# Patient Record
Sex: Male | Born: 1990 | Race: White | Hispanic: No | Marital: Married | Smoking: Never smoker
Health system: Southern US, Community
[De-identification: ages and names within clinical notes are randomized; demographics above are authoritative.]

## PROBLEM LIST (undated history)

## (undated) DIAGNOSIS — S62336A Displaced fracture of neck of fifth metacarpal bone, right hand, initial encounter for closed fracture: Secondary | ICD-10-CM

## (undated) DIAGNOSIS — G709 Myoneural disorder, unspecified: Secondary | ICD-10-CM

## (undated) HISTORY — PX: WISDOM TOOTH EXTRACTION: SHX21

---

## 2012-12-16 ENCOUNTER — Encounter (HOSPITAL_BASED_OUTPATIENT_CLINIC_OR_DEPARTMENT_OTHER): Payer: Self-pay | Admitting: *Deleted

## 2012-12-17 ENCOUNTER — Encounter (HOSPITAL_BASED_OUTPATIENT_CLINIC_OR_DEPARTMENT_OTHER): Payer: Self-pay | Admitting: Anesthesiology

## 2012-12-17 ENCOUNTER — Encounter (HOSPITAL_BASED_OUTPATIENT_CLINIC_OR_DEPARTMENT_OTHER)
Admission: RE | Disposition: A | Discharge status: Home / Self Care | Payer: Self-pay | Source: Ambulatory Visit | Attending: Orthopedic Surgery

## 2012-12-17 ENCOUNTER — Encounter (HOSPITAL_BASED_OUTPATIENT_CLINIC_OR_DEPARTMENT_OTHER): Payer: Self-pay | Admitting: *Deleted

## 2012-12-17 ENCOUNTER — Ambulatory Visit (HOSPITAL_BASED_OUTPATIENT_CLINIC_OR_DEPARTMENT_OTHER)
Admission: RE | Admit: 2012-12-17 | Discharge: 2012-12-17 | Disposition: A | Discharge status: Home / Self Care | Payer: BC Managed Care – PPO | Source: Ambulatory Visit | Attending: Orthopedic Surgery | Admitting: Orthopedic Surgery

## 2012-12-17 ENCOUNTER — Ambulatory Visit (HOSPITAL_BASED_OUTPATIENT_CLINIC_OR_DEPARTMENT_OTHER): Payer: BC Managed Care – PPO | Admitting: Anesthesiology

## 2012-12-17 ENCOUNTER — Encounter (HOSPITAL_BASED_OUTPATIENT_CLINIC_OR_DEPARTMENT_OTHER): Payer: Self-pay | Admitting: Orthopedic Surgery

## 2012-12-17 DIAGNOSIS — Y92009 Unspecified place in unspecified non-institutional (private) residence as the place of occurrence of the external cause: Secondary | ICD-10-CM | POA: Insufficient documentation

## 2012-12-17 DIAGNOSIS — S62339A Displaced fracture of neck of unspecified metacarpal bone, initial encounter for closed fracture: Secondary | ICD-10-CM | POA: Insufficient documentation

## 2012-12-17 DIAGNOSIS — S62336A Displaced fracture of neck of fifth metacarpal bone, right hand, initial encounter for closed fracture: Secondary | ICD-10-CM

## 2012-12-17 DIAGNOSIS — X58XXXA Exposure to other specified factors, initial encounter: Secondary | ICD-10-CM | POA: Insufficient documentation

## 2012-12-17 HISTORY — DX: Myoneural disorder, unspecified: G70.9

## 2012-12-17 HISTORY — PX: OPEN REDUCTION INTERNAL FIXATION (ORIF) METACARPAL: SHX6234

## 2012-12-17 HISTORY — DX: Displaced fracture of neck of fifth metacarpal bone, right hand, initial encounter for closed fracture: S62.336A

## 2012-12-17 SURGERY — OPEN REDUCTION INTERNAL FIXATION (ORIF) METACARPAL
Anesthesia: General | Site: Finger | Laterality: Right | Wound class: Clean

## 2012-12-17 MED ORDER — PROPOFOL 10 MG/ML IV BOLUS
INTRAVENOUS | Status: DC | PRN
  Administered 2012-12-17: 200 mg via INTRAVENOUS

## 2012-12-17 MED ORDER — FENTANYL CITRATE 0.05 MG/ML IJ SOLN: 50.0000 ug | INTRAMUSCULAR | Status: DC | PRN

## 2012-12-17 MED ORDER — FENTANYL CITRATE 0.05 MG/ML IJ SOLN
INTRAMUSCULAR | Status: DC | PRN
  Administered 2012-12-17: 50 ug via INTRAVENOUS

## 2012-12-17 MED ORDER — MIDAZOLAM HCL 5 MG/5ML IJ SOLN
INTRAMUSCULAR | Status: DC | PRN
  Administered 2012-12-17: 2 mg via INTRAVENOUS

## 2012-12-17 MED ORDER — MIDAZOLAM HCL 2 MG/2ML IJ SOLN: 1.0000 mg | INTRAMUSCULAR | Status: DC | PRN

## 2012-12-17 MED ORDER — BUPIVACAINE HCL (PF) 0.5 % IJ SOLN
INTRAMUSCULAR | Status: DC | PRN
  Administered 2012-12-17: 10 mL

## 2012-12-17 MED ORDER — ONDANSETRON HCL 4 MG/2ML IJ SOLN
INTRAMUSCULAR | Status: DC | PRN
  Administered 2012-12-17: 4 mg via INTRAVENOUS

## 2012-12-17 MED ORDER — OXYCODONE-ACETAMINOPHEN 5-325 MG PO TABS: 1.0000 | ORAL_TABLET | Freq: Four times a day (QID) | ORAL | Status: DC | PRN

## 2012-12-17 MED ORDER — CEFAZOLIN SODIUM-DEXTROSE 2-3 GM-% IV SOLR
2.0000 g | INTRAVENOUS | Status: AC
  Administered 2012-12-17: 2 g via INTRAVENOUS

## 2012-12-17 MED ORDER — LIDOCAINE HCL (CARDIAC) 20 MG/ML IV SOLN
INTRAVENOUS | Status: DC | PRN
  Administered 2012-12-17: 80 mg via INTRAVENOUS

## 2012-12-17 MED ORDER — DEXAMETHASONE SODIUM PHOSPHATE 4 MG/ML IJ SOLN
INTRAMUSCULAR | Status: DC | PRN
  Administered 2012-12-17: 10 mg via INTRAVENOUS

## 2012-12-17 MED ORDER — LACTATED RINGERS IV SOLN
INTRAVENOUS | Status: DC
  Administered 2012-12-17: 07:00:00 via INTRAVENOUS

## 2012-12-17 SURGICAL SUPPLY — 60 items
BANDAGE ELASTIC 3 VELCRO ST LF (GAUZE/BANDAGES/DRESSINGS) ×2 IMPLANT
BANDAGE ELASTIC 4 VELCRO ST LF (GAUZE/BANDAGES/DRESSINGS) ×2 IMPLANT
BENZOIN TINCTURE PRP APPL 2/3 (GAUZE/BANDAGES/DRESSINGS) IMPLANT
BLADE MINI RND TIP GREEN BEAV (BLADE) IMPLANT
BLADE SURG 15 STRL LF DISP TIS (BLADE) ×1 IMPLANT
BLADE SURG 15 STRL SS (BLADE) ×1
BNDG COHESIVE 4X5 TAN STRL (GAUZE/BANDAGES/DRESSINGS) IMPLANT
BNDG ESMARK 4X9 LF (GAUZE/BANDAGES/DRESSINGS) ×2 IMPLANT
CLOTH BEACON ORANGE TIMEOUT ST (SAFETY) ×2 IMPLANT
CORDS BIPOLAR (ELECTRODE) ×2 IMPLANT
COVER TABLE BACK 60X90 (DRAPES) ×2 IMPLANT
CUFF TOURNIQUET SINGLE 18IN (TOURNIQUET CUFF) ×2 IMPLANT
DECANTER SPIKE VIAL GLASS SM (MISCELLANEOUS) ×2 IMPLANT
DRAPE EXTREMITY T 121X128X90 (DRAPE) ×2 IMPLANT
DRAPE INCISE IOBAN 66X45 STRL (DRAPES) IMPLANT
DRAPE OEC MINIVIEW 54X84 (DRAPES) ×2 IMPLANT
DRAPE SURG 17X23 STRL (DRAPES) ×2 IMPLANT
DRAPE U 20/CS (DRAPES) ×2 IMPLANT
DURAPREP 26ML APPLICATOR (WOUND CARE) ×2 IMPLANT
GAUZE XEROFORM 1X8 LF (GAUZE/BANDAGES/DRESSINGS) ×2 IMPLANT
GLOVE BIO SURGEON STRL SZ 6.5 (GLOVE) ×2 IMPLANT
GLOVE BIO SURGEON STRL SZ8 (GLOVE) ×2 IMPLANT
GLOVE BIOGEL PI IND STRL 7.0 (GLOVE) ×1 IMPLANT
GLOVE BIOGEL PI IND STRL 8 (GLOVE) ×2 IMPLANT
GLOVE BIOGEL PI INDICATOR 7.0 (GLOVE) ×1
GLOVE BIOGEL PI INDICATOR 8 (GLOVE) ×2
GLOVE ORTHO TXT STRL SZ7.5 (GLOVE) ×2 IMPLANT
GOWN PREVENTION PLUS XLARGE (GOWN DISPOSABLE) ×2 IMPLANT
GOWN STRL REIN XL XLG (GOWN DISPOSABLE) ×4 IMPLANT
K-WIRE .045X4 (WIRE) ×4 IMPLANT
NEEDLE HYPO 25X1 1.5 SAFETY (NEEDLE) ×2 IMPLANT
NS IRRIG 1000ML POUR BTL (IV SOLUTION) ×2 IMPLANT
PACK BASIN DAY SURGERY FS (CUSTOM PROCEDURE TRAY) ×2 IMPLANT
PAD CAST 3X4 CTTN HI CHSV (CAST SUPPLIES) ×1 IMPLANT
PAD CAST 4YDX4 CTTN HI CHSV (CAST SUPPLIES) IMPLANT
PADDING CAST ABS 3INX4YD NS (CAST SUPPLIES) ×1
PADDING CAST ABS 4INX4YD NS (CAST SUPPLIES)
PADDING CAST ABS COTTON 3X4 (CAST SUPPLIES) ×1 IMPLANT
PADDING CAST ABS COTTON 4X4 ST (CAST SUPPLIES) IMPLANT
PADDING CAST COTTON 3X4 STRL (CAST SUPPLIES) ×1
PADDING CAST COTTON 4X4 STRL (CAST SUPPLIES)
SLEEVE SCD COMPRESS KNEE MED (MISCELLANEOUS) ×2 IMPLANT
SPLINT PLASTER CAST XFAST 3X15 (CAST SUPPLIES) IMPLANT
SPLINT PLASTER CAST XFAST 4X15 (CAST SUPPLIES) ×10 IMPLANT
SPLINT PLASTER XTRA FAST SET 4 (CAST SUPPLIES) ×10
SPLINT PLASTER XTRA FASTSET 3X (CAST SUPPLIES)
SPONGE GAUZE 4X4 12PLY (GAUZE/BANDAGES/DRESSINGS) ×2 IMPLANT
STOCKINETTE 4X48 STRL (DRAPES) ×2 IMPLANT
STRIP CLOSURE SKIN 1/2X4 (GAUZE/BANDAGES/DRESSINGS) IMPLANT
SUCTION FRAZIER TIP 10 FR DISP (SUCTIONS) IMPLANT
SUT ETHILON 4 0 PS 2 18 (SUTURE) IMPLANT
SUT MNCRL AB 4-0 PS2 18 (SUTURE) IMPLANT
SUT VIC AB 3-0 SH 27 (SUTURE) ×1
SUT VIC AB 3-0 SH 27X BRD (SUTURE) ×1 IMPLANT
SYR BULB 3OZ (MISCELLANEOUS) ×2 IMPLANT
SYR CONTROL 10ML LL (SYRINGE) ×2 IMPLANT
TOWEL OR 17X24 6PK STRL BLUE (TOWEL DISPOSABLE) ×2 IMPLANT
TUBE CONNECTING 20X1/4 (TUBING) IMPLANT
UNDERPAD 30X30 INCONTINENT (UNDERPADS AND DIAPERS) ×2 IMPLANT
WATER STERILE IRR 1000ML POUR (IV SOLUTION) IMPLANT

## 2012-12-17 NOTE — Anesthesia Postprocedure Evaluation (Signed)
Anesthesia Post Note  Patient: Christopher Chung  Procedure(s) Performed: Procedure(s) (LRB): OPEN REDUCTION INTERNAL FIXATION (ORIF) METACARPAL (Right)  Anesthesia type: General  Patient location: PACU  Post pain: Pain level controlled and Adequate analgesia  Post assessment: Post-op Vital signs reviewed, Patient's Cardiovascular Status Stable, Respiratory Function Stable, Patent Airway and Pain level controlled  Last Vitals:  Filed Vitals:   12/17/12 0915  BP: 146/93  Pulse: 85  Temp:   Resp: 17    Post vital signs: Reviewed and stable  Level of consciousness: awake, alert  and oriented  Complications: No apparent anesthesia complications

## 2012-12-17 NOTE — Op Note (Signed)
12/17/2012  8:33 AM  PATIENT:  Christopher Chung    PRE-OPERATIVE DIAGNOSIS:  RIGHT FRACTURE FIFTH METACARPAL  POST-OPERATIVE DIAGNOSIS:  Same  PROCEDURE:  OPEN REDUCTION INTERNAL FIXATION (ORIF) METACARPAL  SURGEON:  Eulas Post, MD  PHYSICIAN ASSISTANT: Janace Litten, OPA-C, present and scrubbed throughout the case, critical for completion in a timely fashion, and for retraction, instrumentation, and closure.  ANESTHESIA:   General  PREOPERATIVE INDICATIONS:  [name] is a  22 y.o. male with a diagnosis of RIGHT FRACTURE FIFTH METACARPAL who failed conservative measures and elected for surgical management.    The risks benefits and alternatives were discussed with the patient preoperatively including but not limited to the risks of infection, bleeding, nerve injury, cardiopulmonary complications, the need for revision surgery, among others, and the patient was willing to proceed. We also discussed the risks for malunion, nonunion, prominence of the metacarpal and the fracture callus, loss of extension, adhesions of the extensor tendons, among others.  OPERATIVE IMPLANTS: 0.0425 inch K wires x2  OPERATIVE FINDINGS: Malunited fifth metacarpal shaft fracture  OPERATIVE PROCEDURE: The patient was brought to the operating room and placed in the supine position. General anesthesia was administered. IV antibiotics were given. The right upper extremity was prepped and draped in usual sterile fashion. The arm was elevated and exsanguinated and the tourniquet was inflated. The fracture was consolidated and could not be manipulated.  I made a dorsal incision, and retracted the extensor digiti quinti and the extensor to the small finger radially. The superficial nerves were protected. I made a sharp incision in the periosteum and the fracture callus, and dissected down using an osteotome. I mobilized the fracture callus circumferentially, in order to gain mobility of the fracture fragment.  The distal end was then reduced back to the dorsal surface of the shaft. I may have even been slightly over reduced, however it had a tendency to move more palmar, and accepted a slight degree of dorsal reduction anticipate that it will probably shift volarly during the course of healing, even despite the pins. I placed 2 K wires within the medullary canal, which provided satisfactory fixation. Final C-arm pictures were taken, and is overall satisfied with the reduction, and the wires were bent and cut and the wounds irrigated. I repaired the periosteum with 3-0 Vicryl. This was provided a surface underneath the tendons. The tendons were intact at the completion of the case. I irrigated again, injected a regional block in the field, and repaired the skin with nylon followed by an ulnar gutter splint. The tourniquet was released, and he was awakened and returned to the PACU in stable and satisfactory condition. Total tourniquet time was approximately 45 minutes. There no complications, and he tolerated the procedure well.
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
## 2012-12-17 NOTE — Anesthesia Procedure Notes (Signed)
Procedure Name: LMA Insertion Date/Time: 12/17/2012 7:33 AM Performed by: Gar Gibbon Pre-anesthesia Checklist: Patient identified, Emergency Drugs available, Suction available and Patient being monitored Patient Re-evaluated:Patient Re-evaluated prior to inductionOxygen Delivery Method: Circle System Utilized Preoxygenation: Pre-oxygenation with 100% oxygen Intubation Type: IV induction Ventilation: Mask ventilation without difficulty LMA: LMA inserted LMA Size: 4.0 Number of attempts: 1 Airway Equipment and Method: bite block Placement Confirmation: positive ETCO2 Tube secured with: Tape Dental Injury: Teeth and Oropharynx as per pre-operative assessment

## 2012-12-17 NOTE — Transfer of Care (Signed)
Immediate Anesthesia Transfer of Care Note  Patient: Christopher Chung  Procedure(s) Performed: Procedure(s) (LRB) with comments: OPEN REDUCTION INTERNAL FIXATION (ORIF) METACARPAL (Right) - RIGHT: FRACTURE OPEN TREATMENT METACARPAL SINGLE INCLUDES INTERNAL FIXATION EACH BONE  Patient Location: PACU  Anesthesia Type:General  Level of Consciousness: sedated and patient cooperative  Airway & Oxygen Therapy: Patient Spontanous Breathing and Patient connected to face mask oxygen  Post-op Assessment: Report given to PACU RN and Post -op Vital signs reviewed and stable  Post vital signs: Reviewed and stable  Complications: No apparent anesthesia complications

## 2012-12-17 NOTE — Anesthesia Preprocedure Evaluation (Signed)
Anesthesia Evaluation  Patient identified by MRN, date of birth, ID band Patient awake    Reviewed: Allergy & Precautions, H&P , NPO status , Patient's Chart, lab work & pertinent test results  Airway Mallampati: I  Neck ROM: full    Dental   Pulmonary          Cardiovascular     Neuro/Psych    GI/Hepatic   Endo/Other    Renal/GU      Musculoskeletal   Abdominal   Peds  Hematology   Anesthesia Other Findings   Reproductive/Obstetrics                           Anesthesia Physical Anesthesia Plan  ASA: I  Anesthesia Plan: General   Post-op Pain Management:    Induction: Intravenous  Airway Management Planned: LMA  Additional Equipment:   Intra-op Plan:   Post-operative Plan:   Informed Consent: I have reviewed the patients History and Physical, chart, labs and discussed the procedure including the risks, benefits and alternatives for the proposed anesthesia with the patient or authorized representative who has indicated his/her understanding and acceptance.     Plan Discussed with: CRNA and Surgeon  Anesthesia Plan Comments:         Anesthesia Quick Evaluation  

## 2012-12-20 ENCOUNTER — Encounter (HOSPITAL_BASED_OUTPATIENT_CLINIC_OR_DEPARTMENT_OTHER): Payer: Self-pay | Admitting: Orthopedic Surgery

## 2013-09-21 ENCOUNTER — Emergency Department (HOSPITAL_COMMUNITY)
Admission: EM | Admit: 2013-09-21 | Discharge: 2013-09-22 | Disposition: A | Discharge status: Home / Self Care | Payer: BC Managed Care – PPO | Attending: Emergency Medicine | Admitting: Emergency Medicine

## 2013-09-21 ENCOUNTER — Encounter (HOSPITAL_COMMUNITY): Payer: Self-pay | Admitting: Emergency Medicine

## 2013-09-21 DIAGNOSIS — Z79899 Other long term (current) drug therapy: Secondary | ICD-10-CM | POA: Insufficient documentation

## 2013-09-21 DIAGNOSIS — Z8781 Personal history of (healed) traumatic fracture: Secondary | ICD-10-CM | POA: Insufficient documentation

## 2013-09-21 DIAGNOSIS — N201 Calculus of ureter: Secondary | ICD-10-CM | POA: Insufficient documentation

## 2013-09-21 DIAGNOSIS — Z8669 Personal history of other diseases of the nervous system and sense organs: Secondary | ICD-10-CM | POA: Insufficient documentation

## 2013-09-21 LAB — URINALYSIS, ROUTINE W REFLEX MICROSCOPIC
Bilirubin Urine: NEGATIVE
Glucose, UA: NEGATIVE mg/dL
Ketones, ur: NEGATIVE mg/dL
Leukocytes, UA: NEGATIVE
pH: 6 (ref 5.0–8.0)

## 2013-09-21 LAB — COMPREHENSIVE METABOLIC PANEL
ALT: 18 U/L (ref 0–53)
AST: 29 U/L (ref 0–37)
Albumin: 4.3 g/dL (ref 3.5–5.2)
Alkaline Phosphatase: 115 U/L (ref 39–117)
BUN: 12 mg/dL (ref 6–23)
Potassium: 4.4 mEq/L (ref 3.5–5.1)
Sodium: 140 mEq/L (ref 135–145)
Total Protein: 7.3 g/dL (ref 6.0–8.3)

## 2013-09-21 LAB — CBC WITH DIFFERENTIAL/PLATELET
Basophils Absolute: 0.1 10*3/uL (ref 0.0–0.1)
Basophils Relative: 1 % (ref 0–1)
Eosinophils Absolute: 0 10*3/uL (ref 0.0–0.7)
HCT: 40.3 % (ref 39.0–52.0)
MCH: 32.6 pg (ref 26.0–34.0)
MCHC: 36 g/dL (ref 30.0–36.0)
Neutro Abs: 6.2 10*3/uL (ref 1.7–7.7)
Neutrophils Relative %: 71 % (ref 43–77)
Platelets: 212 10*3/uL (ref 150–400)
RDW: 12.6 % (ref 11.5–15.5)
WBC: 8.7 10*3/uL (ref 4.0–10.5)

## 2013-09-21 MED ORDER — MORPHINE SULFATE 4 MG/ML IJ SOLN
4.0000 mg | Freq: Once | INTRAMUSCULAR | Status: AC
  Administered 2013-09-21: 4 mg via INTRAVENOUS
  Filled 2013-09-21: qty 1
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
## 2013-09-21 NOTE — ED Notes (Signed)
Pt c/o flank pain on left hand side, has Hx of previous Kidney stone but never passing

## 2013-09-21 NOTE — ED Notes (Signed)
Pt c/o left sided flank pain in same area where had spontaneous internal bleeding in past; pt sts also had small kidney stone on that side but unsure if could be problem

## 2013-09-22 ENCOUNTER — Emergency Department (HOSPITAL_COMMUNITY): Payer: BC Managed Care – PPO

## 2013-09-22 ENCOUNTER — Encounter (HOSPITAL_COMMUNITY): Payer: Self-pay | Admitting: Radiology

## 2013-09-22 MED ORDER — OXYCODONE-ACETAMINOPHEN 5-325 MG PO TABS: 1.0000 | ORAL_TABLET | ORAL | Status: AC | PRN

## 2013-09-22 MED ORDER — TAMSULOSIN HCL 0.4 MG PO CAPS: 0.4000 mg | ORAL_CAPSULE | Freq: Every day | ORAL | Status: AC

## 2013-09-22 MED ORDER — NAPROXEN 500 MG PO TABS: 500.0000 mg | ORAL_TABLET | Freq: Two times a day (BID) | ORAL | Status: AC

## 2013-09-22 MED ORDER — IOHEXOL 300 MG/ML  SOLN
80.0000 mL | Freq: Once | INTRAMUSCULAR | Status: AC | PRN
  Administered 2013-09-22: 80 mL via INTRAVENOUS

## 2014-06-22 DIAGNOSIS — Q2381 Bicuspid aortic valve: Secondary | ICD-10-CM | POA: Insufficient documentation

## 2014-06-22 DIAGNOSIS — Q231 Congenital insufficiency of aortic valve: Secondary | ICD-10-CM | POA: Insufficient documentation

## 2014-06-22 DIAGNOSIS — R000 Tachycardia, unspecified: Secondary | ICD-10-CM | POA: Insufficient documentation

## 2020-05-12 ENCOUNTER — Emergency Department
Admission: EM | Admit: 2020-05-12 | Discharge: 2020-05-12 | Disposition: A | Discharge status: Home / Self Care | Payer: BC Managed Care – PPO | Source: Home / Self Care | Attending: Family Medicine | Admitting: Family Medicine

## 2020-05-12 ENCOUNTER — Other Ambulatory Visit: Payer: Self-pay

## 2020-05-12 ENCOUNTER — Emergency Department (INDEPENDENT_AMBULATORY_CARE_PROVIDER_SITE_OTHER): Payer: BC Managed Care – PPO

## 2020-05-12 DIAGNOSIS — Y9367 Activity, basketball: Secondary | ICD-10-CM

## 2020-05-12 DIAGNOSIS — S52572A Other intraarticular fracture of lower end of left radius, initial encounter for closed fracture: Secondary | ICD-10-CM

## 2020-05-12 DIAGNOSIS — M25532 Pain in left wrist: Secondary | ICD-10-CM | POA: Diagnosis not present

## 2020-05-12 DIAGNOSIS — W19XXXA Unspecified fall, initial encounter: Secondary | ICD-10-CM

## 2020-05-12 MED ORDER — HYDROCODONE-ACETAMINOPHEN 5-325 MG PO TABS: 1.0000 | ORAL_TABLET | Freq: Four times a day (QID) | ORAL | 0 refills | Status: AC | PRN

## 2020-05-12 MED ORDER — ACETAMINOPHEN 325 MG PO TABS
975.0000 mg | ORAL_TABLET | Freq: Once | ORAL | Status: AC
  Administered 2020-05-12: 975 mg via ORAL

## 2020-05-12 NOTE — ED Triage Notes (Signed)
Pt c/o LT wrist pain since 1.5 hours ago when he fell playing basketball and landed on his wrist. Ltd ROM. Pain 8/10 No meds taken.
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
## 2020-05-12 NOTE — Discharge Instructions (Signed)
Elevate arm.  Wear sling.  Apply ice pack for 20 to 30 minutes, 3 to 4 times daily  Continue until pain and swelling decrease.  May take Tylenol daytime as needed for pain.

## 2020-08-16 ENCOUNTER — Encounter: Payer: Self-pay | Admitting: Emergency Medicine

## 2020-08-16 ENCOUNTER — Emergency Department
Admission: EM | Admit: 2020-08-16 | Discharge: 2020-08-16 | Disposition: A | Discharge status: Home / Self Care | Payer: BC Managed Care – PPO | Source: Home / Self Care

## 2020-08-16 ENCOUNTER — Emergency Department (INDEPENDENT_AMBULATORY_CARE_PROVIDER_SITE_OTHER): Payer: BC Managed Care – PPO

## 2020-08-16 DIAGNOSIS — R0789 Other chest pain: Secondary | ICD-10-CM | POA: Diagnosis not present

## 2020-08-16 NOTE — ED Triage Notes (Signed)
Squeezing chest pain in AM prior to going to work- resolves w/in 30 min Started on Monday - denies caffeine today  Chest pain has been ongoing x 4  Hours today  No radiation  Dx w/ Bicuspid Aortic Valve 5 years ago - no follow up  No Cardio in [employer] vaccine
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
## 2020-08-16 NOTE — Discharge Instructions (Signed)
  You may take 500mg  acetaminophen every 4-6 hours or in combination with ibuprofen 400-600mg  every 6-8 hours as needed for pain and inflammation. Be sure to stay well hydrated and get at least 8 hours of sleep at night. You may alternate cool and warm compresses to see if this helps with the pain.  Call to schedule a follow up appointment with primary care for ongoing healthcare needs, you may need a referral to cardiology if pain persists or new symptoms develop.   Call 911 or have someone drive you to the hospital if symptoms significantly worsening.

## 2021-09-12 IMAGING — DX DG WRIST COMPLETE 3+V*L*
4 series · 4 of 4 positions shown · non-contrast
Comparison: None.

CLINICAL DATA: Left wrist pain after fall while playing basketball

EXAM:
LEFT WRIST - COMPLETE 3+ VIEW

[wrist pa]
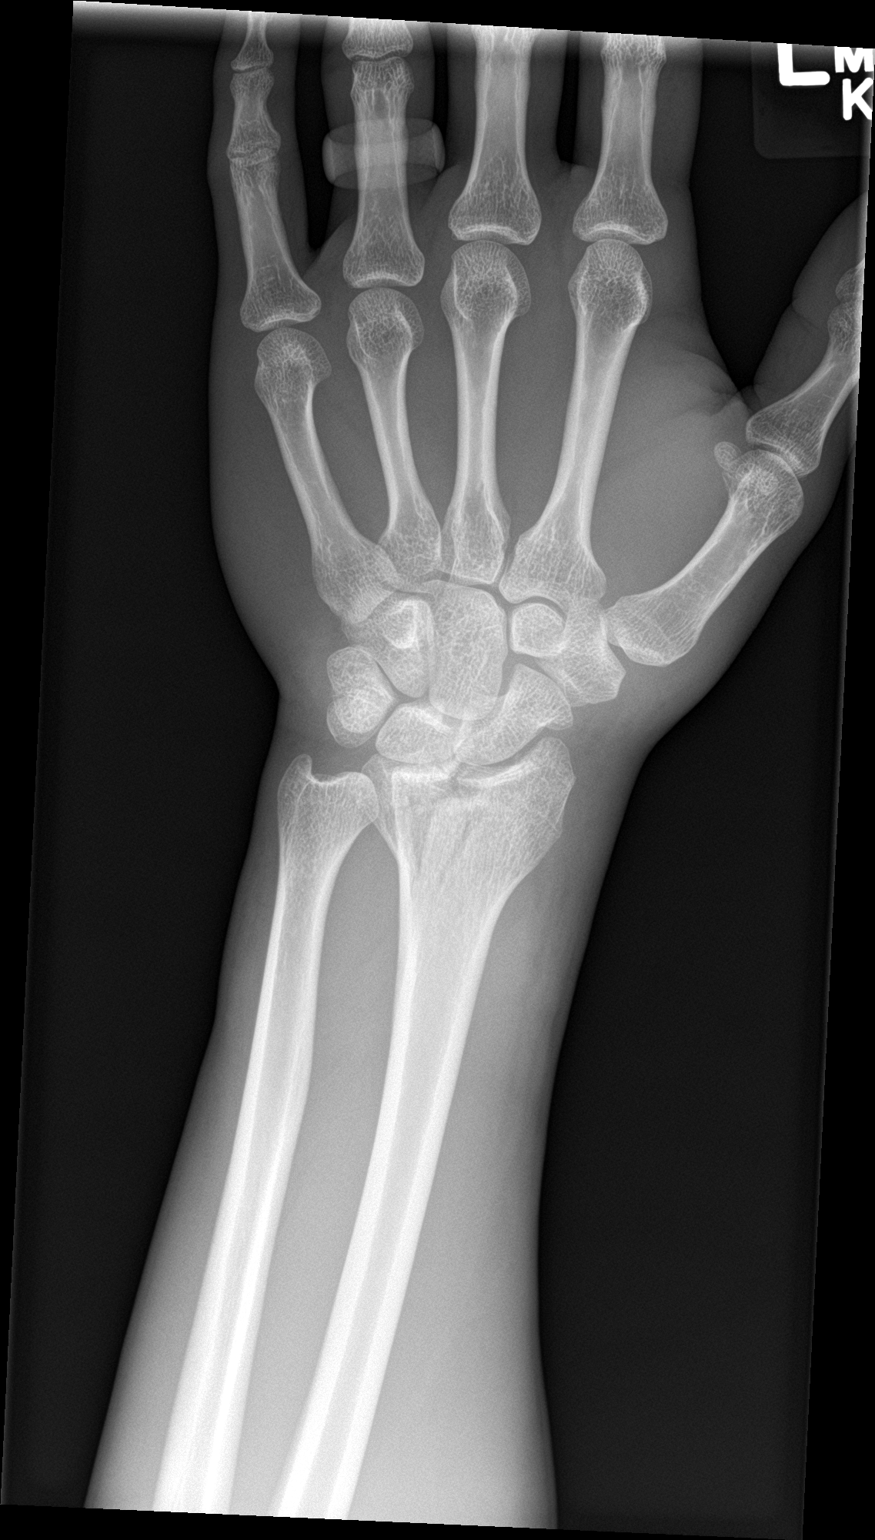

[wrist obl]
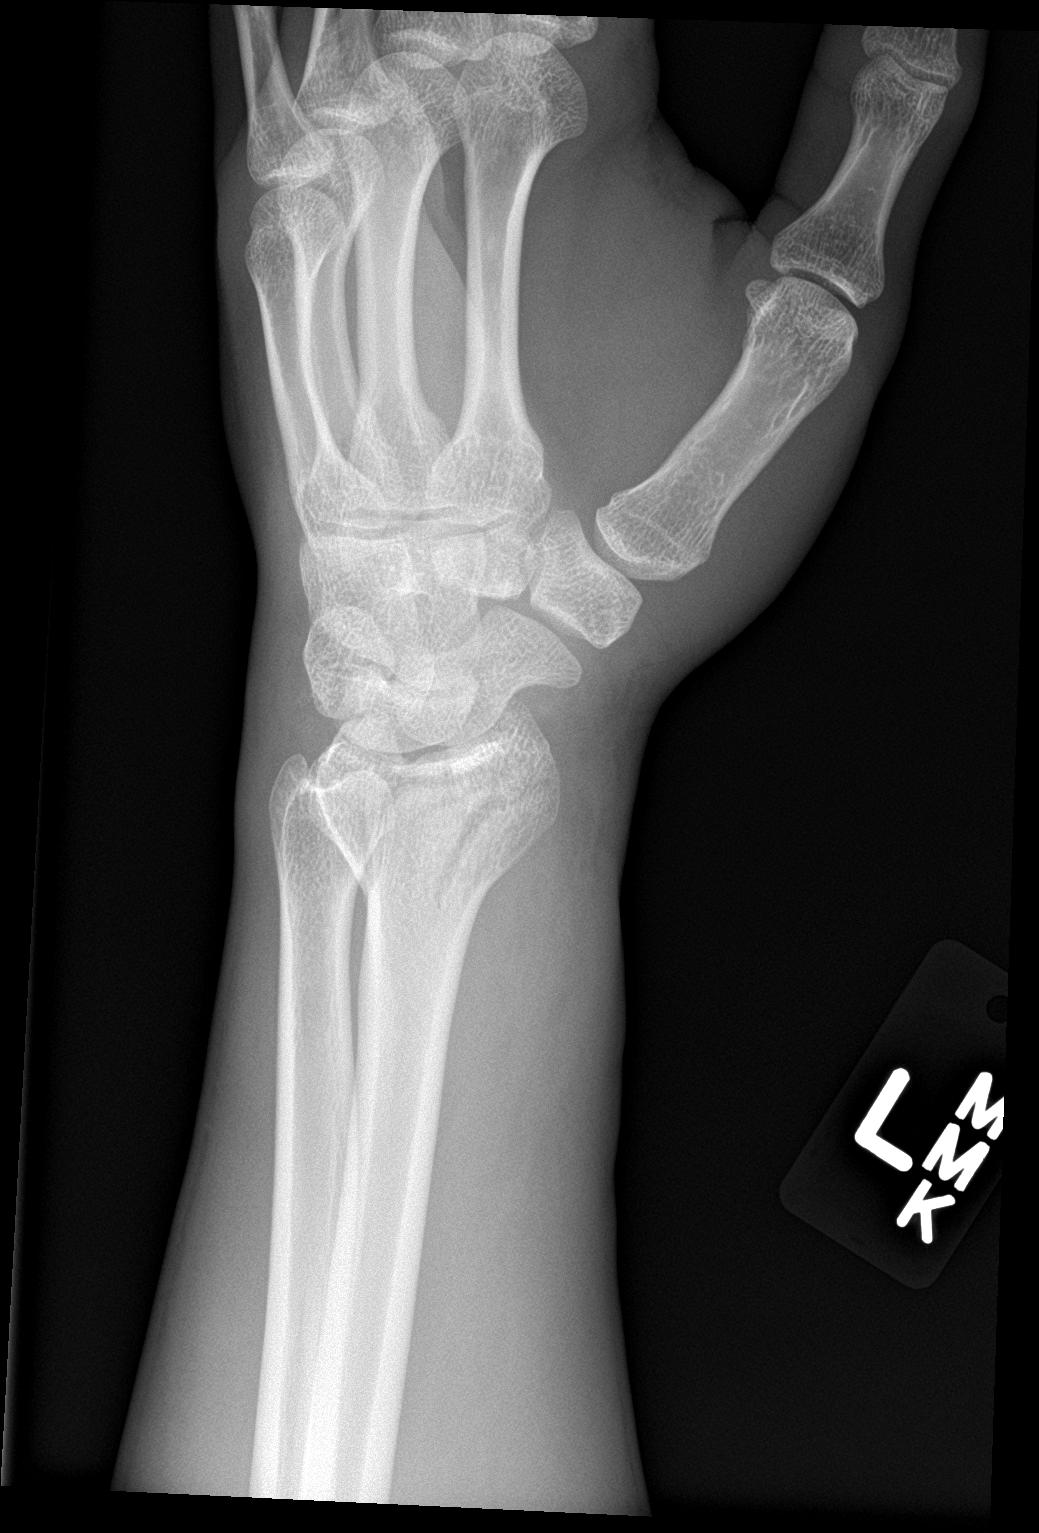

[wrist lat]
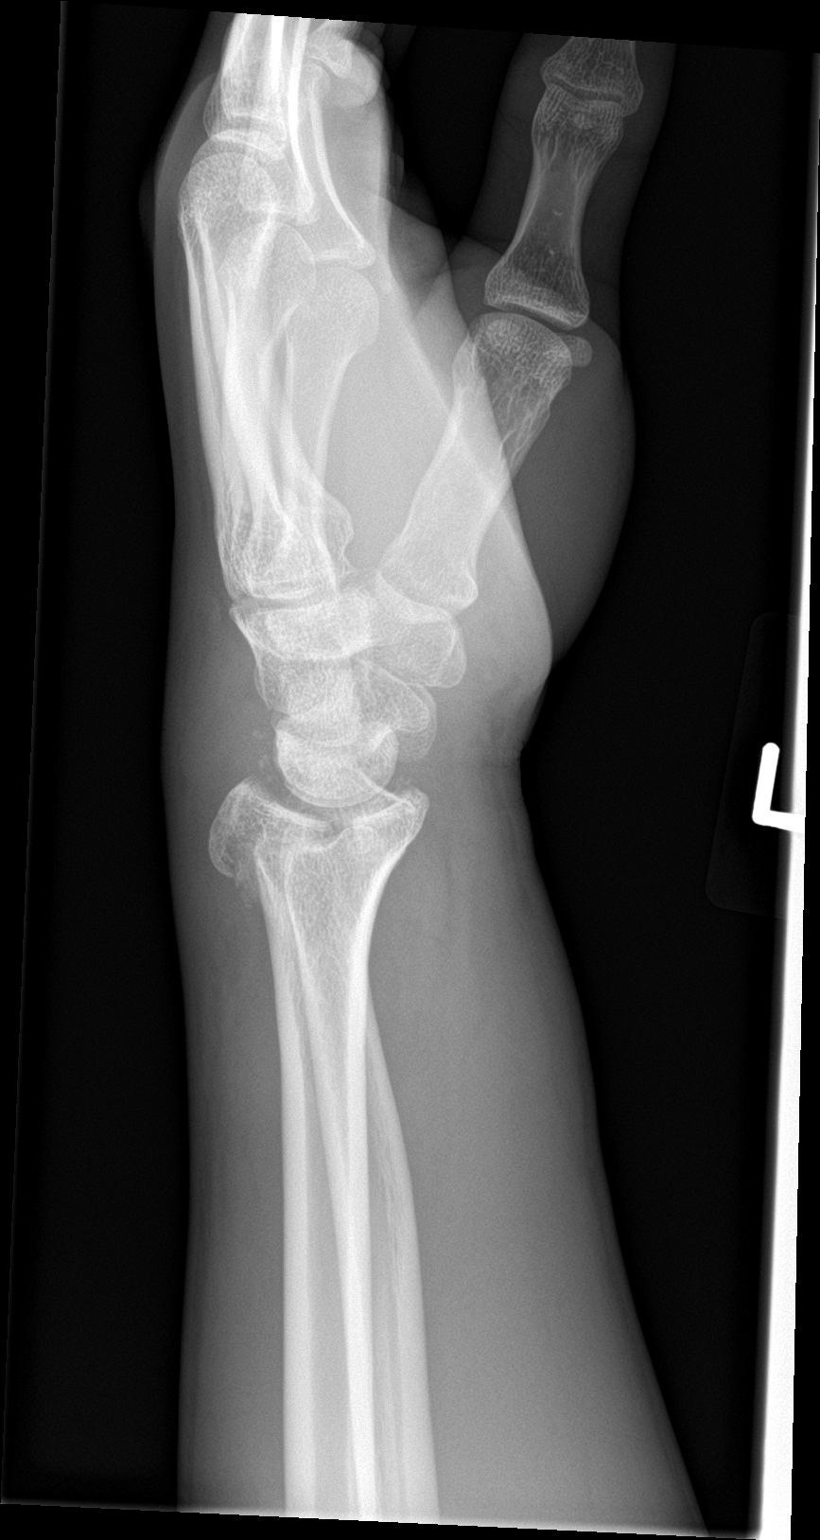

[wrist navicular]
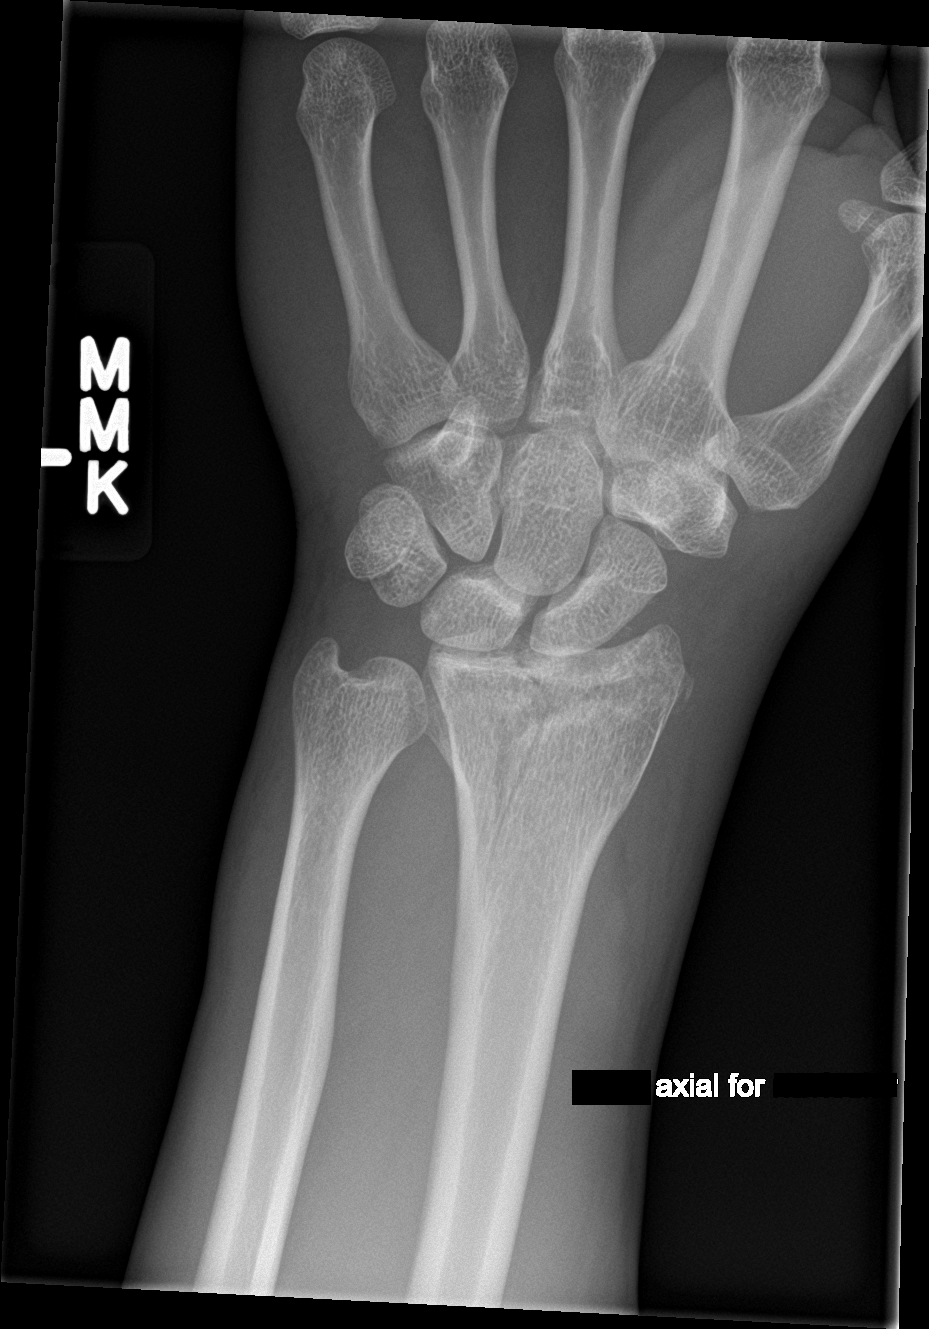

[4 of 4 positions shown; findings below may reference images not displayed]

FINDINGS: Acute comminuted fracture of the distal radial metaphysis with
intra-articular extension to the radiocarpal joint. There is mild
dorsal displacement of the dominant fracture fragment with resultant
3 mm articular-surface diastasis. No significant angulation. Small
mineralized densities posterior to the proximal carpal row likely
tiny fracture fragment emanating from the distal radius. Ulnar
styloid appears intact. The carpal bones and carpal intraosseous
spaces appear intact without evidence of carpal bone fracture.
Diffuse soft tissue swelling at the fracture site.
IMPRESSION: Acute comminuted fracture of the distal radial metaphysis with
intra-articular extension to the radiocarpal joint. Mild dorsal
displacement of the dominant fracture fragment.

## 2023-08-14 ENCOUNTER — Encounter: Payer: Self-pay | Admitting: Emergency Medicine

## 2023-08-14 ENCOUNTER — Other Ambulatory Visit: Payer: Self-pay

## 2023-08-14 ENCOUNTER — Emergency Department (HOSPITAL_BASED_OUTPATIENT_CLINIC_OR_DEPARTMENT_OTHER): Payer: BC Managed Care – PPO

## 2023-08-14 ENCOUNTER — Ambulatory Visit
Admission: EM | Admit: 2023-08-14 | Discharge: 2023-08-14 | Disposition: A | Discharge status: Home / Self Care | Payer: BC Managed Care – PPO

## 2023-08-14 ENCOUNTER — Emergency Department (HOSPITAL_BASED_OUTPATIENT_CLINIC_OR_DEPARTMENT_OTHER)
Admission: EM | Admit: 2023-08-14 | Discharge: 2023-08-14 | Disposition: A | Discharge status: Home / Self Care | Payer: BC Managed Care – PPO | Attending: Emergency Medicine | Admitting: Emergency Medicine

## 2023-08-14 DIAGNOSIS — M7918 Myalgia, other site: Secondary | ICD-10-CM | POA: Diagnosis not present

## 2023-08-14 DIAGNOSIS — M545 Low back pain, unspecified: Secondary | ICD-10-CM | POA: Diagnosis present

## 2023-08-14 DIAGNOSIS — R21 Rash and other nonspecific skin eruption: Secondary | ICD-10-CM | POA: Diagnosis not present

## 2023-08-14 DIAGNOSIS — R109 Unspecified abdominal pain: Secondary | ICD-10-CM | POA: Insufficient documentation

## 2023-08-14 LAB — CBC WITH DIFFERENTIAL/PLATELET
Abs Immature Granulocytes: 0.02 10*3/uL (ref 0.00–0.07)
Basophils Absolute: 0.1 10*3/uL (ref 0.0–0.1)
Basophils Relative: 1 %
Eosinophils Absolute: 0.1 10*3/uL (ref 0.0–0.5)
Eosinophils Relative: 1 %
HCT: 39.6 % (ref 39.0–52.0)
Hemoglobin: 14.3 g/dL (ref 13.0–17.0)
Immature Granulocytes: 0 %
Lymphocytes Relative: 45 %
Lymphs Abs: 2.7 10*3/uL (ref 0.7–4.0)
MCH: 31.7 pg (ref 26.0–34.0)
MCHC: 36.1 g/dL — ABNORMAL HIGH (ref 30.0–36.0)
MCV: 87.8 fL (ref 80.0–100.0)
Monocytes Absolute: 0.7 10*3/uL (ref 0.1–1.0)
Monocytes Relative: 12 %
Neutro Abs: 2.4 10*3/uL (ref 1.7–7.7)
Neutrophils Relative %: 41 %
Platelets: 219 10*3/uL (ref 150–400)
RBC: 4.51 MIL/uL (ref 4.22–5.81)
RDW: 11.7 % (ref 11.5–15.5)
WBC: 6 10*3/uL (ref 4.0–10.5)
nRBC: 0 % (ref 0.0–0.2)

## 2023-08-14 LAB — POCT URINALYSIS DIP (MANUAL ENTRY)
Bilirubin, UA: NEGATIVE
Blood, UA: NEGATIVE
Glucose, UA: NEGATIVE mg/dL
Ketones, POC UA: NEGATIVE mg/dL
Leukocytes, UA: NEGATIVE
Nitrite, UA: NEGATIVE
Protein Ur, POC: NEGATIVE mg/dL
Spec Grav, UA: 1.015 (ref 1.010–1.025)
Urobilinogen, UA: 0.2 E.U./dL
pH, UA: 6.5 (ref 5.0–8.0)

## 2023-08-14 LAB — LIPASE, BLOOD: Lipase: 39 U/L (ref 11–51)

## 2023-08-14 LAB — COMPREHENSIVE METABOLIC PANEL
ALT: 18 U/L (ref 0–44)
AST: 23 U/L (ref 15–41)
Albumin: 4.4 g/dL (ref 3.5–5.0)
Alkaline Phosphatase: 63 U/L (ref 38–126)
Anion gap: 10 (ref 5–15)
BUN: 14 mg/dL (ref 6–20)
CO2: 25 mmol/L (ref 22–32)
Calcium: 9.2 mg/dL (ref 8.9–10.3)
Chloride: 104 mmol/L (ref 98–111)
Creatinine, Ser: 1.03 mg/dL (ref 0.61–1.24)
GFR, Estimated: >60 mL/min (ref >60)
Glucose, Bld: 88 mg/dL (ref 70–99)
Potassium: 3.9 mmol/L (ref 3.5–5.1)
Sodium: 139 mmol/L (ref 135–145)
Total Bilirubin: 0.6 mg/dL (ref 0.3–1.2)
Total Protein: 7 g/dL (ref 6.5–8.1)

## 2023-08-14 MED ORDER — VALACYCLOVIR HCL 1 G PO TABS: 1000.0000 mg | ORAL_TABLET | Freq: Three times a day (TID) | ORAL | 0 refills | Status: AC

## 2023-08-14 MED ORDER — IOHEXOL 300 MG/ML  SOLN: 100.0000 mL | Freq: Once | INTRAMUSCULAR | Status: DC | PRN

## 2023-08-14 MED ORDER — IOHEXOL 350 MG/ML SOLN
100.0000 mL | Freq: Once | INTRAVENOUS | Status: AC | PRN
  Administered 2023-08-14: 100 mL via INTRAVENOUS

## 2023-08-14 MED ORDER — HYOSCYAMINE SULFATE 0.125 MG SL SUBL: 0.1250 mg | SUBLINGUAL_TABLET | Freq: Four times a day (QID) | SUBLINGUAL | 1 refills | Status: AC | PRN

## 2023-08-14 MED ORDER — CELECOXIB 200 MG PO CAPS: 200.0000 mg | ORAL_CAPSULE | Freq: Two times a day (BID) | ORAL | 0 refills | Status: AC | PRN

## 2023-08-14 NOTE — Discharge Instructions (Addendum)
Contact a health care provider if: Your back pain does not improve after several weeks of treatment. Your symptoms get worse. You have a fever. Get help right away if: Your back pain is severe. You cannot stand or walk. You feel nauseous or you vomit. You develop any of the following: Trouble controlling when you urinate or when you have a bowel movement. Pain in your legs. Your feet or legs get very cold, turn pale, or look blue. Weakness in your buttocks or legs.
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
## 2023-08-14 NOTE — ED Notes (Signed)
D/c paperwork reviewed with pt, including prescriptions and follow up care.  All questions and/or concerns addressed at time of d/c.  No further needs expressed. . Pt verbalized understanding, Ambulatory with family to ED exit, NAD.   

## 2023-08-14 NOTE — ED Triage Notes (Signed)
Patient presents to ED via POV from home. Here with left flank pain. History of kidney stones. Denies dysuria.

## 2023-08-14 NOTE — ED Triage Notes (Signed)
Reports left lower back pain starting last night, no known cause. States he's had kidney stones, as well as spontaneous bleeding into his psoas muscle that led to hospitalization. States the back pain feels very similar to the two previous times he had the spontaneous bleeds.
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
## 2023-08-14 NOTE — Discharge Instructions (Signed)
Go to the Emergency department for evaluation

## 2023-08-14 NOTE — ED Notes (Signed)
Patient is being discharged from the Urgent Care and sent to the Emergency Department via POV . Per L. K. Sofia PA, patient is in need of higher level of care due to r/o spontaneous bleed. Patient is aware and verbalizes understanding of plan of care.  Vitals:   08/14/23 1417  BP: (!) 147/89  Pulse: 66  Resp: 16  Temp: 98.4 F (36.9 C)  SpO2: 99%
